# Patient Record
Sex: Male | Born: 2000 | Race: White | Hispanic: No | Marital: Single | Smoking: Current every day smoker
Health system: Southern US, Community
[De-identification: ages and names within clinical notes are randomized; demographics above are authoritative.]

---
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
## 2018-03-17 ENCOUNTER — Encounter: Payer: Self-pay | Admitting: Emergency Medicine

## 2018-03-17 ENCOUNTER — Other Ambulatory Visit: Payer: Self-pay

## 2018-03-17 ENCOUNTER — Emergency Department
Admission: EM | Admit: 2018-03-17 | Discharge: 2018-03-17 | Disposition: A | Discharge status: Home / Self Care | Payer: BLUE CROSS/BLUE SHIELD | Attending: Emergency Medicine | Admitting: Emergency Medicine

## 2018-03-17 DIAGNOSIS — R5381 Other malaise: Secondary | ICD-10-CM | POA: Insufficient documentation

## 2018-03-17 DIAGNOSIS — Z79899 Other long term (current) drug therapy: Secondary | ICD-10-CM | POA: Diagnosis not present

## 2018-03-17 DIAGNOSIS — F17210 Nicotine dependence, cigarettes, uncomplicated: Secondary | ICD-10-CM | POA: Insufficient documentation

## 2018-03-17 DIAGNOSIS — J040 Acute laryngitis: Secondary | ICD-10-CM | POA: Insufficient documentation

## 2018-03-17 DIAGNOSIS — R5383 Other fatigue: Secondary | ICD-10-CM | POA: Insufficient documentation

## 2018-03-17 DIAGNOSIS — R070 Pain in throat: Secondary | ICD-10-CM | POA: Diagnosis present

## 2018-03-17 DIAGNOSIS — R509 Fever, unspecified: Secondary | ICD-10-CM | POA: Diagnosis not present

## 2018-03-17 DIAGNOSIS — J029 Acute pharyngitis, unspecified: Secondary | ICD-10-CM

## 2018-03-17 LAB — GROUP A STREP BY PCR: Group A Strep by PCR: NOT DETECTED

## 2018-03-17 MED ORDER — DEXAMETHASONE SODIUM PHOSPHATE 10 MG/ML IJ SOLN
10.0000 mg | Freq: Once | INTRAMUSCULAR | Status: AC
  Administered 2018-03-17: 10 mg via INTRAMUSCULAR
  Filled 2018-03-17: qty 1

## 2018-03-17 MED ORDER — FLUTICASONE PROPIONATE 50 MCG/ACT NA SUSP: 2.0000 | Freq: Every day | NASAL | 0 refills | Status: AC

## 2018-03-17 MED ORDER — BENZONATATE 100 MG PO CAPS: ORAL_CAPSULE | ORAL | 0 refills | Status: AC

## 2018-03-17 NOTE — ED Notes (Signed)
Pt ambulatory upon discharge. Verbalized understanding of discharge instructions, follow-up care and prescriptions. VSS. Skin warm and dry. A&O x4.  

## 2018-03-17 NOTE — ED Triage Notes (Signed)
Sore throat x 3 days

## 2018-03-17 NOTE — ED Notes (Signed)
Pt to ed with c/o sore throat, mild fever, slight left ear pain since Wednesday.

## 2018-03-17 NOTE — Discharge Instructions (Signed)
Your rapid strep test was negative. Take the prescription meds as directed. Consider starting a daily OTC allergy medicine. [facility] or return for continued symptoms. You will be notified if your throat culture is positive and requires treatment with antibiotics.

## 2018-03-17 NOTE — ED Provider Notes (Signed)
[facility] Emergency Department Provider Note ____________________________________________  Time seen: [id number]  I have reviewed the triage vital signs and the nursing notes.  HISTORY  Chief Complaint  Sore Throat  HPI Peter Barnes is a 17 y.o. male presents to the ED accompanied by his mother for evaluation of a 3-day complaint of sore throat with subjective fevers.  Patient describes onset of symptoms on Wednesday.  He also notes some fatigue and malaise.  He denies any nausea, vomiting, or dizziness.  He also denies any rash or diarrhea.  He denies any  sick contacts, or recent travel.  He reports intermittently productive cough.  He has not taken any medications over-the-counter for symptom management.  History reviewed. No pertinent past medical history.  There are no active problems to display for this patient.  History reviewed. No pertinent surgical history.  Prior to Admission medications   Medication Sig Start Date End Date Taking? Authorizing Provider  benzonatate (TESSALON PERLES) 100 MG capsule Take 1-2 tabs TID prn cough 03/17/18   Hudson Lehmkuhl, Charlesetta IvoryJenise V Bacon, PA-C  doxycycline (VIBRAMYCIN) 100 MG capsule Take 1 capsule (100 mg total) by mouth 2 (two) times daily. 08/13/15   Triplett, Cari B, FNP  fluticasone (FLONASE) 50 MCG/ACT nasal spray Place 2 sprays into both nostrils daily. 03/17/18   Shaheer Bonfield, Charlesetta IvoryJenise V Bacon, PA-C  hydrocortisone 2.5 % ointment Apply topically 2 (two) times daily. 08/13/15   Triplett, Cari B, FNP  ibuprofen (ADVIL,MOTRIN) 800 MG tablet Take 1 tablet (800 mg total) by mouth every 8 (eight) hours as needed for moderate pain. 07/23/17   Irean HongSung, Jade J, MD  lidocaine (XYLOCAINE) 2 % solution Use as directed 10 mLs in the mouth or throat as needed for mouth pain. 02/06/17   Enid DerryWagner, Ashley, PA-C  penicillin v potassium (VEETID) 500 MG tablet Take 1 tablet (500 mg total) by mouth 4 (four) times daily. 08/23/16   Evon SlackGaines, Thomas C, PA-C   traMADol (ULTRAM) 50 MG tablet Take 1 tablet (50 mg total) by mouth every 6 (six) hours as needed. 08/23/16   Evon SlackGaines, Thomas C, PA-C    Allergies Patient has no known allergies.  Family History  Problem Relation Age of Onset  . Hypertension Mother     Social History Social History   Tobacco Use  . Smoking status: Current Every Day Smoker    Packs/day: 0.50    Types: Cigarettes  . Smokeless tobacco: Never Used  Substance Use Topics  . Alcohol use: No    Alcohol/week: 0.0 oz  . Drug use: No    Review of Systems  Constitutional: Positive for subjective fever. Eyes: Negative for visual changes. ENT: Positive for sore throat. Cardiovascular: Negative for chest pain. Respiratory: Negative for shortness of breath. Reports intermittently productive cough. Gastrointestinal: Negative for abdominal pain, vomiting and diarrhea. Skin: Negative for rash. Neurological: Negative for headaches, focal weakness or numbness. ____________________________________________  PHYSICAL EXAM:  VITAL SIGNS: ED Triage Vitals  Enc Vitals Group     BP 03/17/18 0906 (!) 127/54     Pulse Rate 03/17/18 0906 80     Resp 03/17/18 0906 18     Temp 03/17/18 0906 97.8 F (36.6 C)     Temp Source 03/17/18 0906 Oral     SpO2 03/17/18 0906 97 %     Weight 03/17/18 0907 185 lb (83.9 kg)     Height 03/17/18 0907 6\' 1"  (1.854 m)     Head Circumference --  Peak Flow --      Pain Score 03/17/18 0907 3     Pain Loc --      Pain Edu? --      Excl. in GC? --     Constitutional: Alert and oriented. Well appearing and in no distress. Head: Normocephalic and atraumatic. Eyes: Conjunctivae are normal. PERRL. Normal extraocular movements Ears: Canals clear. TMs intact bilaterally. Nose: No congestion/rhinorrhea/epistaxis. Mouth/Throat: Mucous membranes are moist.  Uvula is midline and tonsils are flat.  No oropharyngeal lesions are noted.  There is some mild erythema noted globally. Weak voice on  exam Neck: Supple. No thyromegaly. Hematological/Lymphatic/Immunological: No cervical lymphadenopathy. Cardiovascular: Normal rate, regular rhythm. Normal distal pulses. Respiratory: Normal respiratory effort. No wheezes/rales/rhonchi. ____________________________________________   LABS (pertinent positives/negatives) Labs Reviewed  GROUP A STREP BY PCR  CULTURE, GROUP A STREP [facility])  ____________________________________________  PROCEDURES  Procedures Decadron 10 mg IM ____________________________________________  INITIAL IMPRESSION / ASSESSMENT AND PLAN / ED COURSE  Patient with ED evaluation of sore throat and intermittent fevers.  Patient's exam is overall benign.  Symptoms likely represent a viral pharyngitis with some acute laryngitis.  His rapid strep is negative and throat culture is pending.  Patient will be discharged with prescriptions for [facility] and Flonase.  He should dose over-the-counter allergy medicine for additional symptom relief.  Return precautions have been reviewed. ____________________________________________  FINAL CLINICAL IMPRESSION(S) / ED DIAGNOSES  Final diagnoses:  Viral pharyngitis  Laryngitis, acute      Karmen Stabs, Charlesetta Ivory, PA-C 03/17/18 1146    Emily Filbert, MD 03/17/18 [id number]

## 2018-03-19 LAB — CULTURE, GROUP A STREP (THRC): SPECIAL REQUESTS: NORMAL

## 2018-05-13 ENCOUNTER — Other Ambulatory Visit: Payer: Self-pay

## 2018-05-13 ENCOUNTER — Emergency Department
Admission: EM | Admit: 2018-05-13 | Discharge: 2018-05-13 | Disposition: A | Discharge status: Home / Self Care | Payer: BLUE CROSS/BLUE SHIELD | Attending: Emergency Medicine | Admitting: Emergency Medicine

## 2018-05-13 DIAGNOSIS — M62838 Other muscle spasm: Secondary | ICD-10-CM | POA: Insufficient documentation

## 2018-05-13 DIAGNOSIS — F17210 Nicotine dependence, cigarettes, uncomplicated: Secondary | ICD-10-CM | POA: Diagnosis not present

## 2018-05-13 DIAGNOSIS — M549 Dorsalgia, unspecified: Secondary | ICD-10-CM | POA: Diagnosis present

## 2018-05-13 MED ORDER — METHOCARBAMOL 500 MG PO TABS
1000.0000 mg | ORAL_TABLET | Freq: Once | ORAL | Status: AC
  Administered 2018-05-13: 1000 mg via ORAL
  Filled 2018-05-13: qty 2

## 2018-05-13 MED ORDER — KETOROLAC TROMETHAMINE 10 MG PO TABS: 10.0000 mg | ORAL_TABLET | Freq: Four times a day (QID) | ORAL | 0 refills | Status: AC | PRN

## 2018-05-13 MED ORDER — METHOCARBAMOL 500 MG PO TABS: 500.0000 mg | ORAL_TABLET | Freq: Three times a day (TID) | ORAL | 0 refills | Status: AC | PRN

## 2018-05-13 MED ORDER — POLYMYXIN B-TRIMETHOPRIM 10000-0.1 UNIT/ML-% OP SOLN: 1.0000 [drp] | OPHTHALMIC | 0 refills | Status: DC

## 2018-05-13 MED ORDER — KETOROLAC TROMETHAMINE 30 MG/ML IJ SOLN
30.0000 mg | Freq: Once | INTRAMUSCULAR | Status: AC
  Administered 2018-05-13: 30 mg via INTRAMUSCULAR
  Filled 2018-05-13: qty 1

## 2018-05-13 MED ORDER — AMOXICILLIN 875 MG PO TABS: 875.0000 mg | ORAL_TABLET | Freq: Two times a day (BID) | ORAL | 0 refills | Status: DC

## 2018-05-13 NOTE — ED Provider Notes (Signed)
[facility] Emergency Department Provider Note  ____________________________________________  Time seen: Approximately 3:37 PM  I have reviewed the triage vital signs and the nursing notes.   HISTORY  Chief Complaint Back Pain    HPI Peter Barnes is a 17 y.o. male presenting to the emergency department with left-sided paraspinal muscle tenderness along the thoracic spine.  Patient reports that he routinely undergoes heavy lifting at his place of work.  He denies weakness, radiculopathy or changes in sensation in the upper or lower extremities.  No bowel or bladder incontinence or saddle anesthesia.  He currently rates his pain at 7 out of 10 in intensity.  Patient seems fatigued in emergency department room but reports that he works nights and is typically asleep at this time.   History reviewed. No pertinent past medical history.  There are no active problems to display for this patient.   History reviewed. No pertinent surgical history.  Prior to Admission medications   Medication Sig Start Date End Date Taking? Authorizing Provider  benzonatate (TESSALON PERLES) 100 MG capsule Take 1-2 tabs TID prn cough 03/17/18   Menshew, Charlesetta IvoryJenise V Bacon, PA-C  doxycycline (VIBRAMYCIN) 100 MG capsule Take 1 capsule (100 mg total) by mouth 2 (two) times daily. 08/13/15   Triplett, Cari B, FNP  fluticasone (FLONASE) 50 MCG/ACT nasal spray Place 2 sprays into both nostrils daily. 03/17/18   Menshew, Charlesetta IvoryJenise V Bacon, PA-C  hydrocortisone 2.5 % ointment Apply topically 2 (two) times daily. 08/13/15   Triplett, Cari B, FNP  ibuprofen (ADVIL,MOTRIN) 800 MG tablet Take 1 tablet (800 mg total) by mouth every 8 (eight) hours as needed for moderate pain. 07/23/17   Irean HongSung, Jade J, MD  ketorolac (TORADOL) 10 MG tablet Take 1 tablet (10 mg total) by mouth every 6 (six) hours as needed for up to 5 days. 05/13/18 05/18/18  Orvil FeilWoods, Madelon Welsch M, PA-C  lidocaine (XYLOCAINE) 2 % solution Use as  directed 10 mLs in the mouth or throat as needed for mouth pain. 02/06/17   Enid DerryWagner, Ashley, PA-C  methocarbamol (ROBAXIN) 500 MG tablet Take 1 tablet (500 mg total) by mouth every 8 (eight) hours as needed for up to 5 days for muscle spasms. 05/13/18 05/18/18  Orvil FeilWoods, Audre Cenci M, PA-C  penicillin v potassium (VEETID) 500 MG tablet Take 1 tablet (500 mg total) by mouth 4 (four) times daily. 08/23/16   Evon SlackGaines, Thomas C, PA-C  traMADol (ULTRAM) 50 MG tablet Take 1 tablet (50 mg total) by mouth every 6 (six) hours as needed. 08/23/16   Evon SlackGaines, Thomas C, PA-C    Allergies Patient has no known allergies.  Family History  Problem Relation Age of Onset  . Hypertension Mother     Social History Social History   Tobacco Use  . Smoking status: Current Every Day Smoker    Packs/day: 0.50    Types: Cigarettes  . Smokeless tobacco: Never Used  Substance Use Topics  . Alcohol use: No    Alcohol/week: 0.0 oz  . Drug use: No     Review of Systems  Constitutional: No fever/chills Eyes: No visual changes. No discharge ENT: No upper respiratory complaints. Cardiovascular: no chest pain. Respiratory: no cough. No SOB. Gastrointestinal: No abdominal pain.  No nausea, no vomiting.  No diarrhea.  No constipation. Musculoskeletal: Patient has upper back pain.  Skin: Negative for rash, abrasions, lacerations, ecchymosis. Neurological: Negative for headaches, focal weakness or numbness.   ____________________________________________   PHYSICAL EXAM:  VITAL SIGNS:  ED Triage Vitals  Enc Vitals Group     BP 05/13/18 1515 121/66     Pulse Rate 05/13/18 1515 67     Resp 05/13/18 1515 16     Temp 05/13/18 1515 97.7 F (36.5 C)     Temp Source 05/13/18 1515 Oral     SpO2 05/13/18 1515 98 %     Weight 05/13/18 1516 185 lb (83.9 kg)     Height 05/13/18 1516 6\' 1"  (1.854 m)     Head Circumference --      Peak Flow --      Pain Score 05/13/18 1515 6     Pain Loc --      Pain Edu? --      Excl. in GC?  --      Constitutional: Alert and oriented. Well appearing and in no acute distress. Eyes: Conjunctivae are normal. PERRL. EOMI. Head: Atraumatic. Cardiovascular: Normal rate, regular rhythm. Normal S1 and S2.  Good peripheral circulation. Respiratory: Normal respiratory effort without tachypnea or retractions. Lungs CTAB. Good air entry to the bases with no decreased or absent breath sounds. Gastrointestinal: Bowel sounds 4 quadrants. Soft and nontender to palpation. No guarding or rigidity. No palpable masses. No distention. No CVA tenderness. Musculoskeletal: Full range of motion to all extremities. No gross deformities appreciated.  Patient has left-sided paraspinal muscle tenderness along the thoracic spine.  Negative straight leg raise test bilaterally. Neurologic:  Normal speech and language. No gross focal neurologic deficits are appreciated.  Skin:  Skin is warm, dry and intact. No rash noted. Psychiatric: Mood and affect are normal. Speech and behavior are normal. Patient exhibits appropriate insight and judgement.   ____________________________________________   LABS (all labs ordered are listed, but only abnormal results are displayed)  Labs Reviewed - No data to display ____________________________________________  EKG   ____________________________________________  RADIOLOGY   No results found.  ____________________________________________    PROCEDURES  Procedure(s) performed:    Procedures    Medications  ketorolac (TORADOL) 30 MG/ML injection 30 mg (30 mg Intramuscular Given 05/13/18 1544)  methocarbamol (ROBAXIN) tablet 1,000 mg (1,000 mg Oral Given 05/13/18 1543)     ____________________________________________   INITIAL IMPRESSION / ASSESSMENT AND PLAN / ED COURSE  Pertinent labs & imaging results that were available during my care of the patient were reviewed by me and considered in my medical decision making (see chart for  details).  Review of the [place] CSRS was performed in accordance of the [facility] prior to dispensing any controlled drugs.     Assessment and plan Muscle spasm Patient presents to the emergency department with paraspinal muscle tenderness along the thoracic spine consistent with muscle spasm.  Patient was given Robaxin and Toradol in the emergency department.  He was discharged with Robaxin and Toradol.  Patient was advised to follow-up with primary care as needed.  All patient questions were answered.    ____________________________________________  FINAL CLINICAL IMPRESSION(S) / ED DIAGNOSES  Final diagnoses:  Muscle spasm      NEW MEDICATIONS STARTED DURING THIS VISIT:  ED Discharge Orders        Ordered    trimethoprim-polymyxin b (POLYTRIM) ophthalmic solution  Every 4 hours,   Status:  Discontinued     05/13/18 1607    amoxicillin (AMOXIL) 875 MG tablet  2 times daily,   Status:  Discontinued     05/13/18 1607    ketorolac (TORADOL) 10 MG tablet  Every 6 hours PRN  05/13/18 1616    methocarbamol (ROBAXIN) 500 MG tablet  Every 8 hours PRN     05/13/18 1616          This chart was dictated using voice recognition software/Dragon. Despite best efforts to proofread, errors can occur which can change the meaning. Any change was purely unintentional.    Orvil Feil, PA-C 05/13/18 1745    Phineas Semen, MD 05/13/18 [phone]

## 2018-05-13 NOTE — ED Triage Notes (Signed)
Pt c/o mid back pain for the past week. States his job requires him to lift 60lb constantly . States he does not want to file WC..Marland Kitchen

## 2018-05-13 NOTE — ED Notes (Signed)
See triage note  States he developed mid to upper back pain about 1 week ago  Unsure of specific injury  Pain increases with movement  Denies taking any PO meds for pain but has tried ice

## 2019-01-23 IMAGING — CR DG HAND COMPLETE 3+V*R*
1 series · 3 of 3 positions shown · non-contrast
Comparison: Right fifth finger radiographs performed 11/02/2008

CLINICAL DATA: Jammed right hand, with pain at the third
metacarpal. Initial encounter.

EXAM:
RIGHT HAND - COMPLETE 3+ VIEW

[Series 1: dg hand complete right · 0.14mm/px · 3 of 3 slices shown]
[im 1/3]
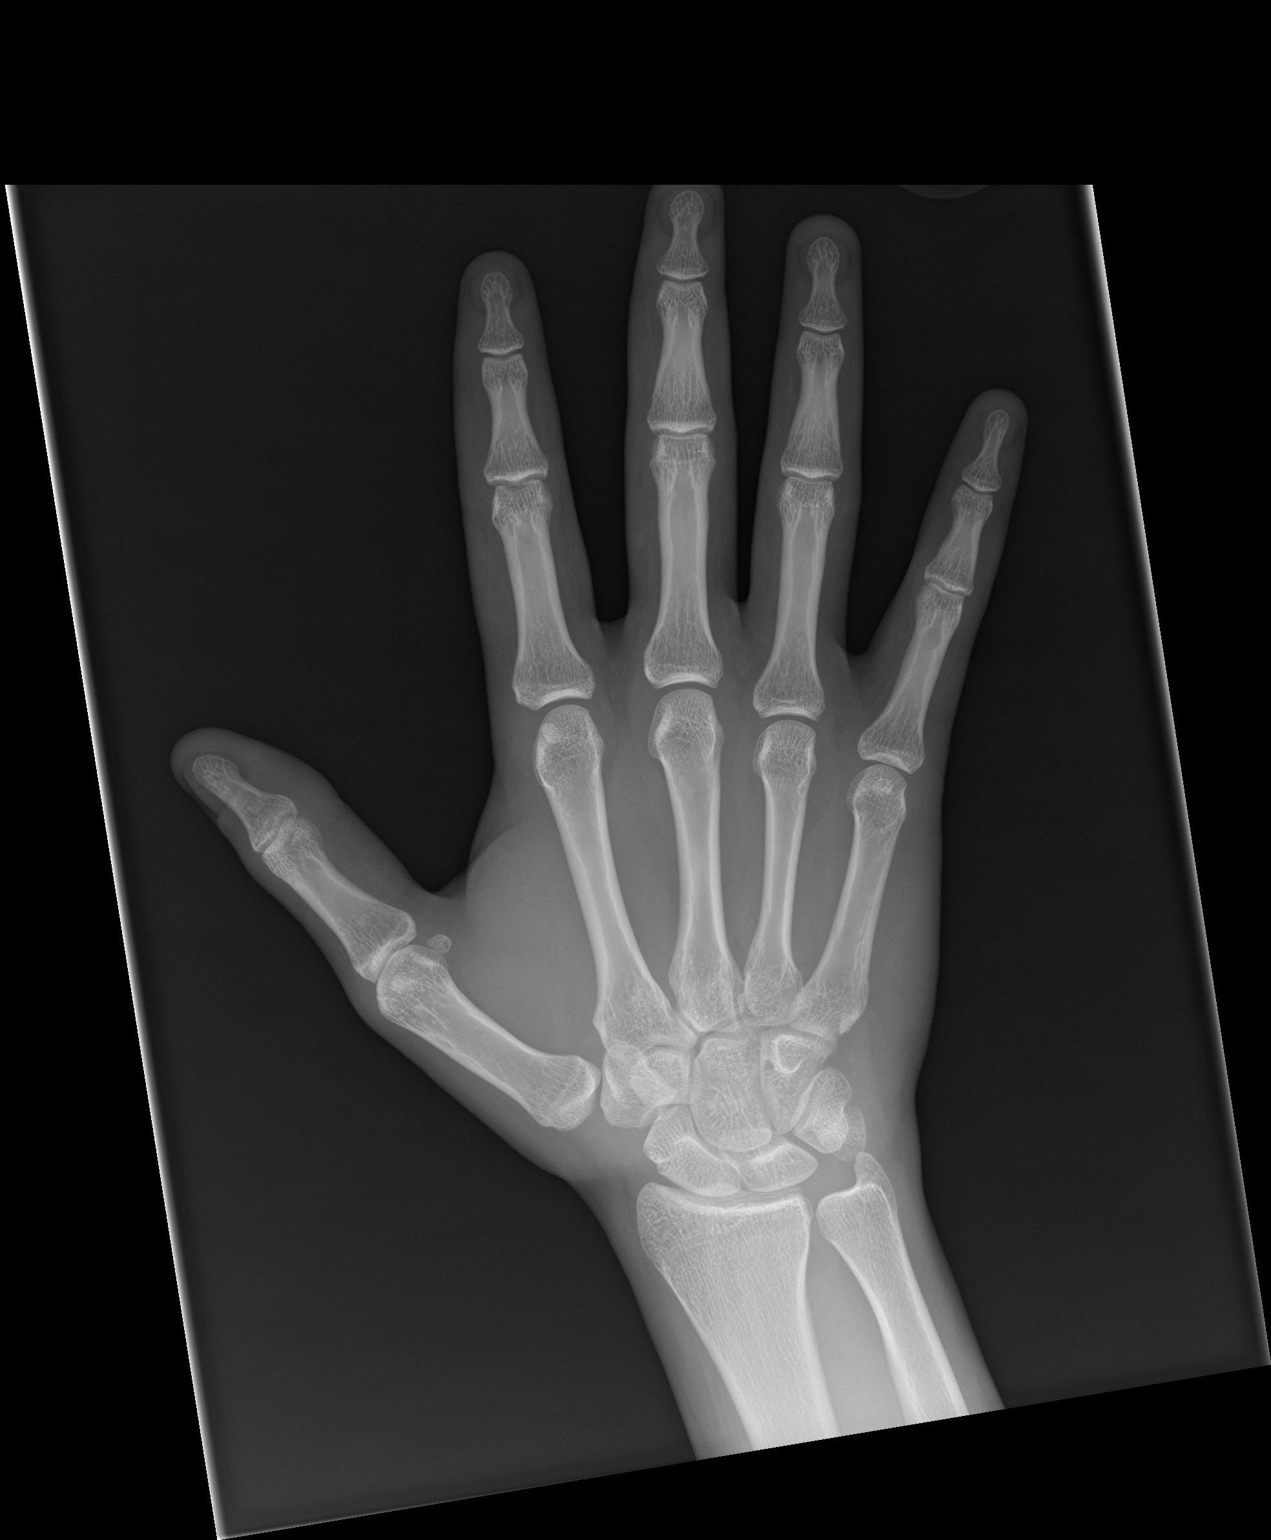
[im 2/3]
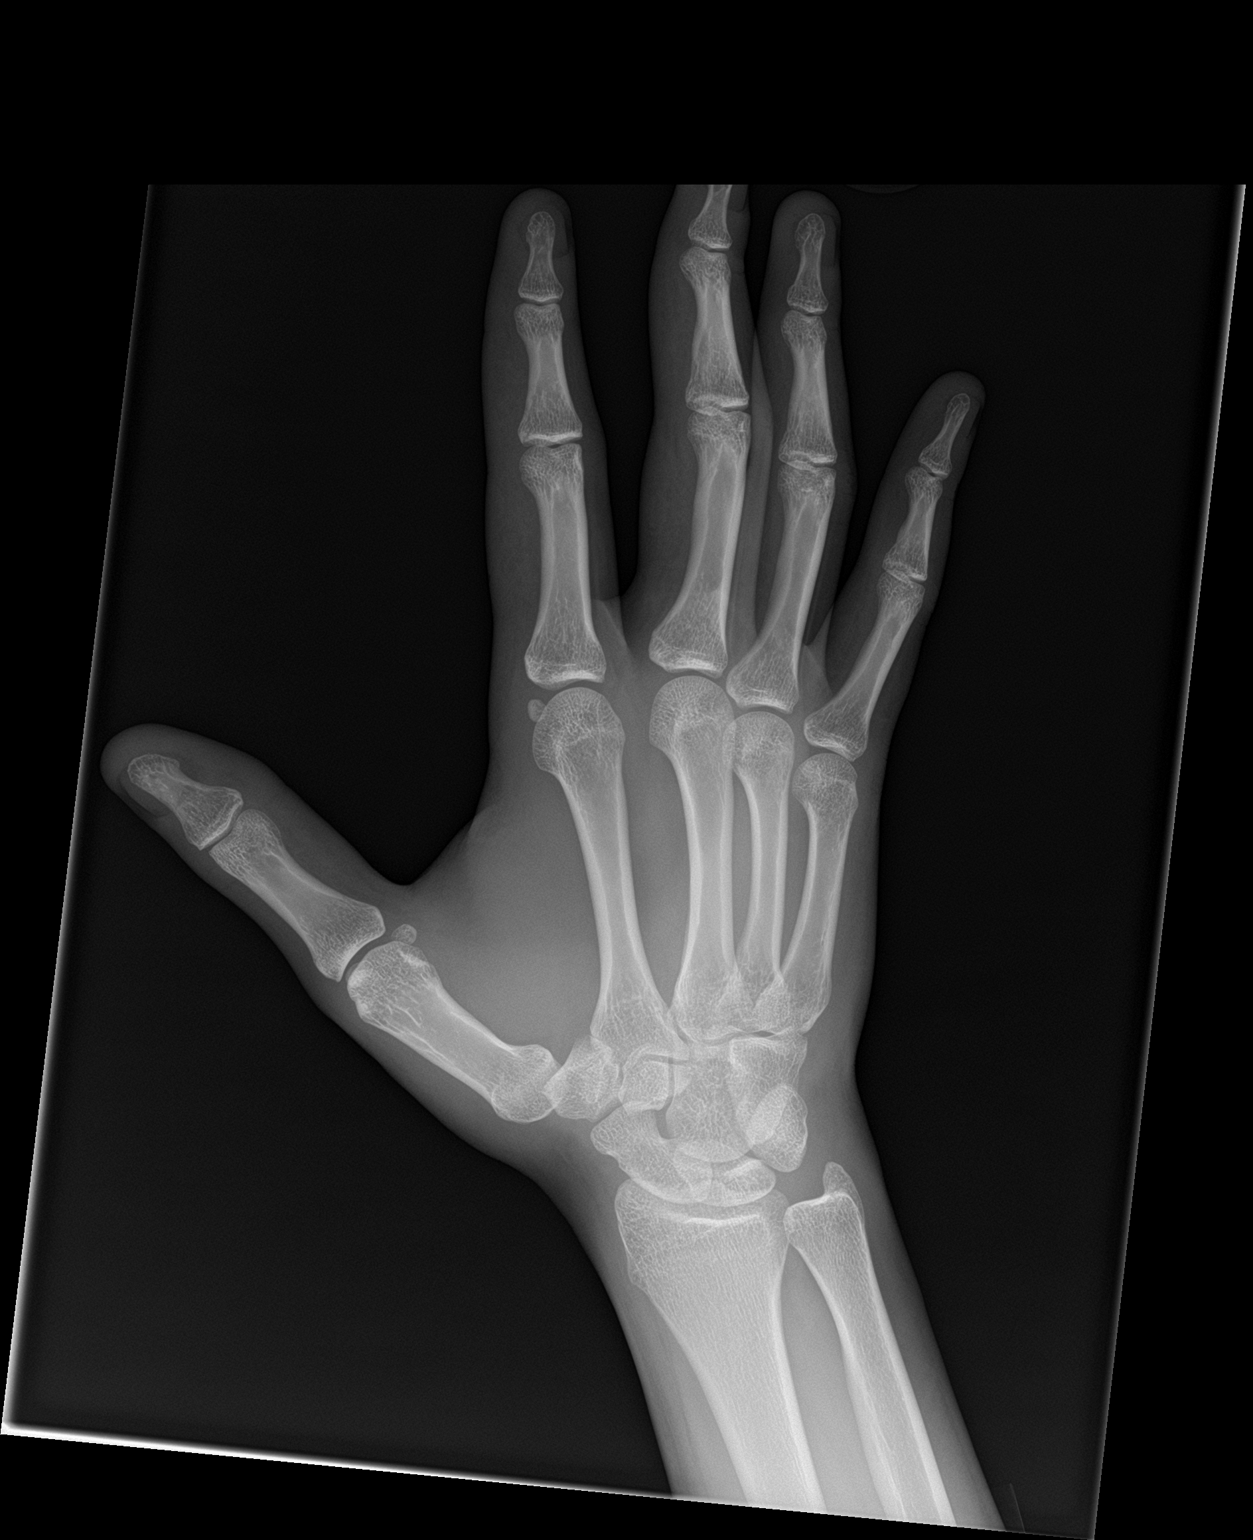
[im 3/3]
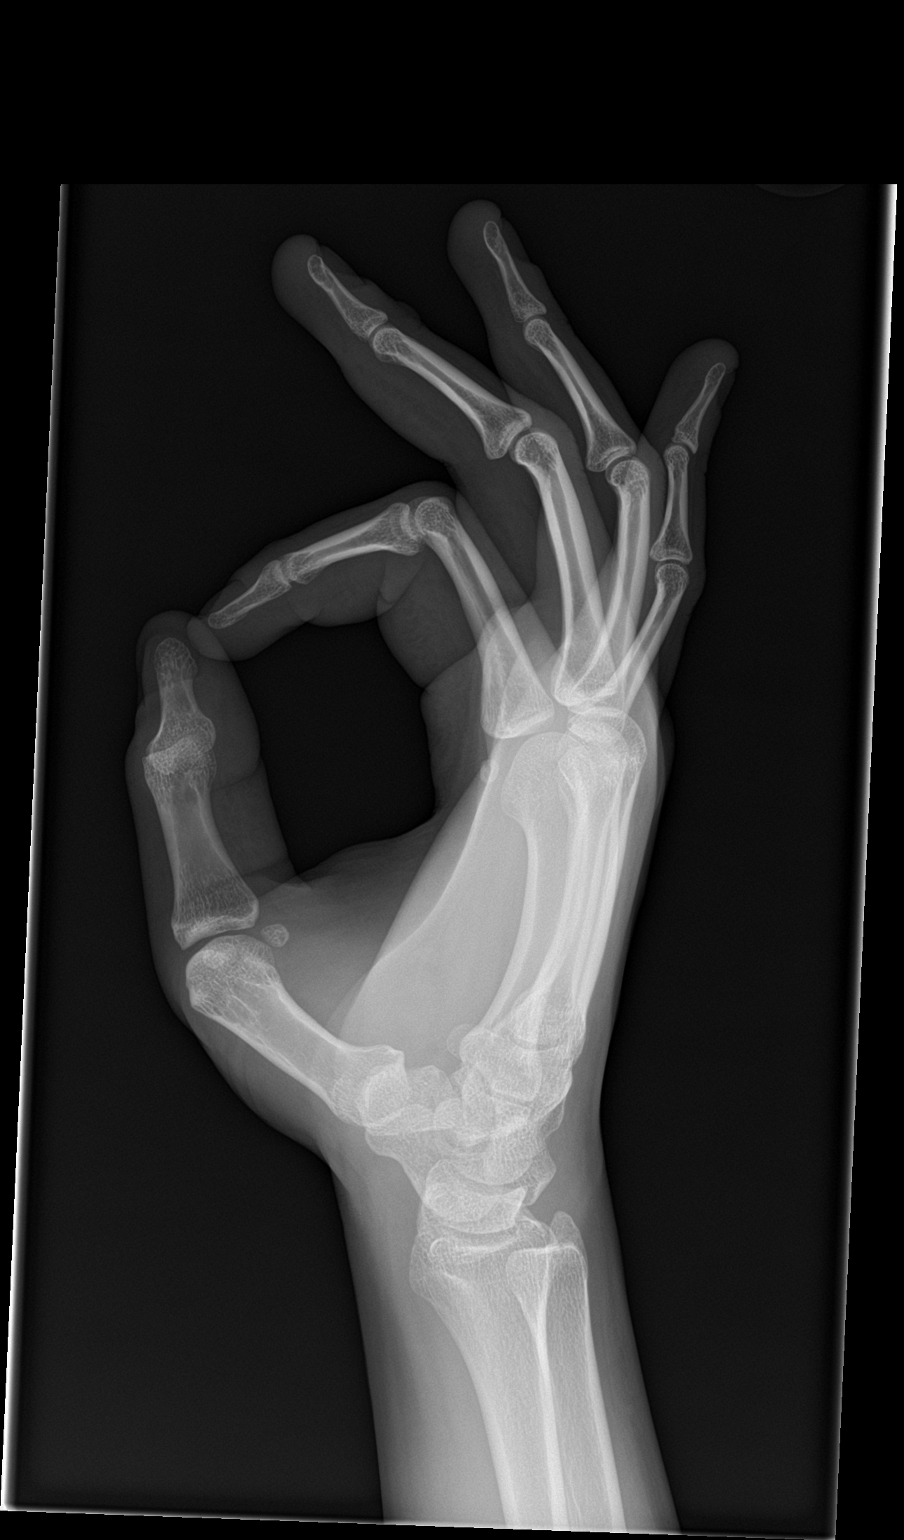

[3 of 3 positions shown; findings below may reference images not displayed]

FINDINGS: There is no evidence of fracture or dislocation. The joint spaces
are preserved. The carpal rows are intact, and demonstrate normal
alignment. The soft tissues are unremarkable in appearance.
IMPRESSION: No evidence of fracture or dislocation.
